# Patient Record
Sex: Female | Born: 1950 | Race: White | Hispanic: No | Marital: Single
Health system: Southern US, Community
[De-identification: ages and names within clinical notes are randomized; demographics above are authoritative.]

## PROBLEM LIST (undated history)

## (undated) DIAGNOSIS — J9621 Acute and chronic respiratory failure with hypoxia: Secondary | ICD-10-CM

## (undated) DIAGNOSIS — I469 Cardiac arrest, cause unspecified: Secondary | ICD-10-CM

## (undated) DIAGNOSIS — N184 Chronic kidney disease, stage 4 (severe): Secondary | ICD-10-CM

## (undated) DIAGNOSIS — J189 Pneumonia, unspecified organism: Secondary | ICD-10-CM

## (undated) DIAGNOSIS — U071 COVID-19: Secondary | ICD-10-CM

---

## 2020-11-19 ENCOUNTER — Other Ambulatory Visit (HOSPITAL_COMMUNITY): Payer: Medicare Other

## 2020-11-19 ENCOUNTER — Institutional Professional Consult (permissible substitution)
Admission: RE | Admit: 2020-11-19 | Discharge: 2020-12-09 | Disposition: A | Discharge status: Rehabilitation Facility | Payer: Medicare Other | Attending: Internal Medicine | Admitting: Internal Medicine

## 2020-11-19 DIAGNOSIS — J189 Pneumonia, unspecified organism: Secondary | ICD-10-CM | POA: Diagnosis present

## 2020-11-19 DIAGNOSIS — J9621 Acute and chronic respiratory failure with hypoxia: Secondary | ICD-10-CM | POA: Diagnosis present

## 2020-11-19 DIAGNOSIS — N184 Chronic kidney disease, stage 4 (severe): Secondary | ICD-10-CM | POA: Diagnosis present

## 2020-11-19 DIAGNOSIS — Z931 Gastrostomy status: Secondary | ICD-10-CM

## 2020-11-19 DIAGNOSIS — U071 COVID-19: Secondary | ICD-10-CM | POA: Diagnosis present

## 2020-11-19 DIAGNOSIS — I469 Cardiac arrest, cause unspecified: Secondary | ICD-10-CM | POA: Diagnosis present

## 2020-11-19 HISTORY — DX: COVID-19: U07.1

## 2020-11-19 HISTORY — DX: Chronic kidney disease, stage 4 (severe): N18.4

## 2020-11-19 HISTORY — DX: Cardiac arrest, cause unspecified: I46.9

## 2020-11-19 HISTORY — DX: Pneumonia, unspecified organism: J18.9

## 2020-11-19 HISTORY — DX: Acute and chronic respiratory failure with hypoxia: J96.21

## 2020-11-19 MED ORDER — IOHEXOL 180 MG/ML  SOLN
50.0000 mL | Freq: Once | INTRAMUSCULAR | Status: AC | PRN
  Administered 2020-11-19: 50 mL

## 2020-11-20 DIAGNOSIS — J9621 Acute and chronic respiratory failure with hypoxia: Secondary | ICD-10-CM | POA: Diagnosis not present

## 2020-11-20 DIAGNOSIS — J189 Pneumonia, unspecified organism: Secondary | ICD-10-CM

## 2020-11-20 DIAGNOSIS — I469 Cardiac arrest, cause unspecified: Secondary | ICD-10-CM | POA: Diagnosis not present

## 2020-11-20 DIAGNOSIS — N184 Chronic kidney disease, stage 4 (severe): Secondary | ICD-10-CM | POA: Diagnosis not present

## 2020-11-20 DIAGNOSIS — U071 COVID-19: Secondary | ICD-10-CM | POA: Diagnosis not present

## 2020-11-20 LAB — BASIC METABOLIC PANEL
Anion gap: 10 (ref 5–15)
BUN: 61 mg/dL — ABNORMAL HIGH (ref 8–23)
CO2: 27 mmol/L (ref 22–32)
Calcium: 8 mg/dL — ABNORMAL LOW (ref 8.9–10.3)
Chloride: 104 mmol/L (ref 98–111)
Creatinine, Ser: 2.32 mg/dL — ABNORMAL HIGH (ref 0.44–1.00)
GFR, Estimated: 22 mL/min — ABNORMAL LOW (ref >60)
Glucose, Bld: 138 mg/dL — ABNORMAL HIGH (ref 70–99)
Potassium: 3.8 mmol/L (ref 3.5–5.1)
Sodium: 141 mmol/L (ref 135–145)

## 2020-11-20 LAB — CBC
HCT: 24.1 % — ABNORMAL LOW (ref 36.0–46.0)
Hemoglobin: 7.4 g/dL — ABNORMAL LOW (ref 12.0–15.0)
MCH: 30 pg (ref 26.0–34.0)
MCHC: 30.7 g/dL (ref 30.0–36.0)
MCV: 97.6 fL (ref 80.0–100.0)
Platelets: 295 10*3/uL (ref 150–400)
RBC: 2.47 MIL/uL — ABNORMAL LOW (ref 3.87–5.11)
RDW: 16.3 % — ABNORMAL HIGH (ref 11.5–15.5)
WBC: 7 10*3/uL (ref 4.0–10.5)
nRBC: 0 % (ref 0.0–0.2)

## 2020-11-20 NOTE — Consult Note (Signed)
Pulmonary [place]  Date of Service: 11/20/2020  PULMONARY CRITICAL CARE CONSULT   DYANNA KAMBER  [id number]  DOB: 08/23/1951   DOA: 11/19/2020  Referring Physician: Merton Border, MD  HPI: Elizabeth Goodwin is a 70 y.o. female seen for follow up of Acute on Chronic Respiratory Failure.  Patient has multiple medical problems including chronic kidney disease hypertension hyperlipidemia coronary artery disease diabetes morbid obesity who came into the hospital because of having been found unresponsive.  The patient apparently was in cardiac arrest had recently tested positive for COVID-19 patient came into the emergency department on the ventilator and admitted to the ICU at the time of his initial presentation she was in pulseless electrical activity.  Hospital course was complicated had worsening of her renal function and eventually also was hypotensive.  Patient also was placed on temporary dialysis it appears.  Other complications included development of pneumonia treated for healthcare infection.  Because of failure to wean she was transferred to our facility after trach and PEG had been placed  Review of Systems:  ROS performed and is unremarkable other than noted above.   Past medical history: Chronic kidney disease stage IV Hypertension Hyperlipidemia Coronary artery disease Diabetes Morbid obesity  Past surgical history: Tracheostomy PEG tube  Social history: Unknown tobacco alcohol or drug abuse   Medications: Reviewed on Rounds  Physical Exam:  Vitals: Temperature is 97.6 pulse 85 respiratory 29 blood pressure is 118/82 saturations 96%  Ventilator Settings on T collar FiO2 is 35%  . General: Comfortable at this time . Eyes: Grossly normal lids, irises & conjunctiva . ENT: grossly tongue is normal . Neck: no obvious mass . Cardiovascular: S1-S2 normal no gallop or rub . Respiratory: No rhonchi very  coarse breath sound . Abdomen: Soft and nontender . Skin: no rash seen on limited exam . Musculoskeletal: not rigid . Psychiatric:unable to assess . Neurologic: no seizure no involuntary movements         Labs on Admission:  Basic Metabolic Panel: Recent Labs  Lab 11/20/20 0447  NA 141  K 3.8  CL 104  CO2 27  GLUCOSE 138*  BUN 61*  CREATININE 2.32*  CALCIUM 8.0*    No results for input(s): PHART, PCO2ART, PO2ART, HCO3, O2SAT in the last 168 hours.  Liver Function Tests: No results for input(s): AST, ALT, ALKPHOS, BILITOT, PROT, ALBUMIN in the last 168 hours. No results for input(s): LIPASE, AMYLASE in the last 168 hours. No results for input(s): AMMONIA in the last 168 hours.  CBC: Recent Labs  Lab 11/20/20 0447  WBC 7.0  HGB 7.4*  HCT 24.1*  MCV 97.6  PLT 295    Cardiac Enzymes: No results for input(s): CKTOTAL, CKMB, CKMBINDEX, TROPONINI in the last 168 hours.  BNP (last 3 results) No results for input(s): BNP in the last 8760 hours.  ProBNP (last 3 results) No results for input(s): PROBNP in the last 8760 hours.   Radiological Exams on Admission: DG ABDOMEN PEG TUBE LOCATION  Result Date: 11/19/2020 CLINICAL DATA:  Peg tube placement EXAM: ABDOMEN - 1 VIEW COMPARISON:  None. FINDINGS: Frontal view of the upper abdomen was obtained after the installation of oral contrast into an indwelling percutaneous gastrostomy tube by the clinician. Contrast is seen outlining the gastric rugal folds. No evidence of contrast extravasation. Bowel gas pattern is unremarkable. IMPRESSION: 1. Percutaneous gastrostomy tube within the gastric lumen. No evidence of contrast extravasation. Electronically Signed  By: Randa Ngo M.D.   On: 11/19/2020 22:45    Assessment/Plan Active Problems:   Acute on chronic respiratory failure with hypoxia (HCC)   Chronic kidney disease, stage IV (severe) ([place])   COVID-19 virus infection   Cardiac arrest ([place])   Healthcare-associated  pneumonia   1. Acute on chronic respiratory failure hypoxia patient is doing well so far with the weaning currently is on 35% FiO2.  We will try to advance the weaning as tolerated. 2. Chronic kidney disease stage IV we will continue to monitor urine output.  Consider nephrology follow-up 3. COVID-19 virus infection in recovery phase we will continue with supportive care. 4. Cardiac arrest right now rhythm is stable we will continue to monitor closely 5. Healthcare associated pneumonia treated  I have personally seen and evaluated the patient, evaluated laboratory and imaging results, formulated the assessment and plan and placed orders. The Patient requires high complexity decision making with multiple systems involvement.  Case was discussed on Rounds with the Respiratory Therapy Director and the Respiratory staff Time Spent 64mnutes  Woodley Petzold A Nneoma Harral, MD FCommunity Memorial HospitalPulmonary Critical Care Medicine Sleep Medicine

## 2020-11-21 DIAGNOSIS — I469 Cardiac arrest, cause unspecified: Secondary | ICD-10-CM | POA: Diagnosis not present

## 2020-11-21 DIAGNOSIS — U071 COVID-19: Secondary | ICD-10-CM | POA: Diagnosis not present

## 2020-11-21 DIAGNOSIS — J9621 Acute and chronic respiratory failure with hypoxia: Secondary | ICD-10-CM | POA: Diagnosis not present

## 2020-11-21 DIAGNOSIS — N184 Chronic kidney disease, stage 4 (severe): Secondary | ICD-10-CM | POA: Diagnosis not present

## 2020-11-21 NOTE — Progress Notes (Signed)
Pulmonary Critical Care Medicine [place]   PULMONARY CRITICAL CARE SERVICE  PROGRESS NOTE  Date of Service: 11/21/2020  Elizabeth Goodwin  [id number]  DOB: 29-Dec-1950   DOA: 11/19/2020  Referring Physician: Merton Border, MD  HPI: Elizabeth Goodwin is a 70 y.o. female seen for follow up of Acute on Chronic Respiratory Failure.  Patient is currently on T collar on 35% FiO2 ready for downsizing the trach  Medications: Reviewed on Rounds  Physical Exam:  Vitals: Temperature is 98.0 pulse 80 respiratory 19 blood pressure is 112/73 saturations 96  Ventilator Settings on T collar with an FiO2 of 35  . General: Comfortable at this time . Eyes: Grossly normal lids, irises & conjunctiva . ENT: grossly tongue is normal . Neck: no obvious mass . Cardiovascular: S1 S2 normal no gallop . Respiratory: Scattered rhonchi expansion is equal at this time . Abdomen: soft . Skin: no rash seen on limited exam . Musculoskeletal: not rigid . Psychiatric:unable to assess . Neurologic: no seizure no involuntary movements         Lab Data:   Basic Metabolic Panel: Recent Labs  Lab 11/20/20 0447  NA 141  K 3.8  CL 104  CO2 27  GLUCOSE 138*  BUN 61*  CREATININE 2.32*  CALCIUM 8.0*    ABG: No results for input(s): PHART, PCO2ART, PO2ART, HCO3, O2SAT in the last 168 hours.  Liver Function Tests: No results for input(s): AST, ALT, ALKPHOS, BILITOT, PROT, ALBUMIN in the last 168 hours. No results for input(s): LIPASE, AMYLASE in the last 168 hours. No results for input(s): AMMONIA in the last 168 hours.  CBC: Recent Labs  Lab 11/20/20 0447  WBC 7.0  HGB 7.4*  HCT 24.1*  MCV 97.6  PLT 295    Cardiac Enzymes: No results for input(s): CKTOTAL, CKMB, CKMBINDEX, TROPONINI in the last 168 hours.  BNP (last 3 results) No results for input(s): BNP in the last 8760 hours.  ProBNP (last 3 results) No results for input(s): PROBNP in the last 8760  hours.  Radiological Exams: DG ABDOMEN PEG TUBE LOCATION  Result Date: 11/19/2020 CLINICAL DATA:  Peg tube placement EXAM: ABDOMEN - 1 VIEW COMPARISON:  None. FINDINGS: Frontal view of the upper abdomen was obtained after the installation of oral contrast into an indwelling percutaneous gastrostomy tube by the clinician. Contrast is seen outlining the gastric rugal folds. No evidence of contrast extravasation. Bowel gas pattern is unremarkable. IMPRESSION: 1. Percutaneous gastrostomy tube within the gastric lumen. No evidence of contrast extravasation. Electronically Signed   By: Randa Ngo M.D.   On: 11/19/2020 22:45    Assessment/Plan Active Problems:   Acute on chronic respiratory failure with hypoxia (HCC)   Chronic kidney disease, stage IV (severe) ([place])   COVID-19 virus infection   Cardiac arrest ([place])   Healthcare-associated pneumonia   1. Acute on chronic respiratory failure hypoxia we will proceed to downsizing the trach to a #6 cuffless continue with T collar weaning as tolerated 2. Chronic kidney disease stage IV we will continue with supportive care. 3. COVID-19 virus infection recovery phase 4. Cardiac arrest rhythm stable 5. Healthcare associated pneumonia treated we will continue to follow   I have personally seen and evaluated the patient, evaluated laboratory and imaging results, formulated the assessment and plan and placed orders. The Patient requires high complexity decision making with multiple systems involvement.  Rounds were done with the Respiratory Therapy Director and Staff therapists and discussed with nursing staff  also.  Allyne Gee, MD [facility] Pulmonary Critical Care Medicine Sleep Medicine

## 2020-11-22 DIAGNOSIS — N184 Chronic kidney disease, stage 4 (severe): Secondary | ICD-10-CM | POA: Diagnosis not present

## 2020-11-22 DIAGNOSIS — J9621 Acute and chronic respiratory failure with hypoxia: Secondary | ICD-10-CM | POA: Diagnosis not present

## 2020-11-22 DIAGNOSIS — U071 COVID-19: Secondary | ICD-10-CM | POA: Diagnosis not present

## 2020-11-22 DIAGNOSIS — I469 Cardiac arrest, cause unspecified: Secondary | ICD-10-CM | POA: Diagnosis not present

## 2020-11-22 NOTE — Progress Notes (Signed)
Pulmonary Critical Care Medicine [place]   PULMONARY CRITICAL CARE SERVICE  PROGRESS NOTE  Date of Service: 11/22/2020  Elizabeth Goodwin  [id number]  DOB: 12/13/1950   DOA: 11/19/2020  Referring Physician: Merton Border, MD  HPI: Elizabeth Goodwin is a 70 y.o. female seen for follow up of Acute on Chronic Respiratory Failure.  On T collar right now on 35% FiO2 using PMV  Medications: Reviewed on Rounds  Physical Exam:  Vitals: Temperature is 96.5 pulse 81 respiratory 20 blood pressure is 155/79 saturations 96%  Ventilator Settings on T collar with an FiO2 of 35%  . General: Comfortable at this time . Eyes: Grossly normal lids, irises & conjunctiva . ENT: grossly tongue is normal . Neck: no obvious mass . Cardiovascular: S1 S2 normal no gallop . Respiratory: Scattered rhonchi expansion is equal . Abdomen: soft . Skin: no rash seen on limited exam . Musculoskeletal: not rigid . Psychiatric:unable to assess . Neurologic: no seizure no involuntary movements         Lab Data:   Basic Metabolic Panel: Recent Labs  Lab 11/20/20 0447  NA 141  K 3.8  CL 104  CO2 27  GLUCOSE 138*  BUN 61*  CREATININE 2.32*  CALCIUM 8.0*    ABG: No results for input(s): PHART, PCO2ART, PO2ART, HCO3, O2SAT in the last 168 hours.  Liver Function Tests: No results for input(s): AST, ALT, ALKPHOS, BILITOT, PROT, ALBUMIN in the last 168 hours. No results for input(s): LIPASE, AMYLASE in the last 168 hours. No results for input(s): AMMONIA in the last 168 hours.  CBC: Recent Labs  Lab 11/20/20 0447  WBC 7.0  HGB 7.4*  HCT 24.1*  MCV 97.6  PLT 295    Cardiac Enzymes: No results for input(s): CKTOTAL, CKMB, CKMBINDEX, TROPONINI in the last 168 hours.  BNP (last 3 results) No results for input(s): BNP in the last 8760 hours.  ProBNP (last 3 results) No results for input(s): PROBNP in the last 8760 hours.  Radiological Exams: No results  found.  Assessment/Plan Active Problems:   Acute on chronic respiratory failure with hypoxia (HCC)   Chronic kidney disease, stage IV (severe) ([place])   COVID-19 virus infection   Cardiac arrest ([place])   Healthcare-associated pneumonia   1. Acute on chronic respiratory failure with hypoxia we will continue with T collar trials titrate oxygen continue pulmonary toilet secretions are fairly moderate 2. Chronic kidney disease stage IV we will continue to follow 3. COVID-19 virus infection recovery phase 4. Cardiac arrest rhythm is stable 5. Healthcare associated pneumonia treated slowly improving   I have personally seen and evaluated the patient, evaluated laboratory and imaging results, formulated the assessment and plan and placed orders. The Patient requires high complexity decision making with multiple systems involvement.  Rounds were done with the Respiratory Therapy Director and Staff therapists and discussed with nursing staff also.  Allyne Gee, MD [facility] Pulmonary Critical Care Medicine Sleep Medicine

## 2020-11-23 ENCOUNTER — Encounter: Payer: Self-pay | Admitting: Internal Medicine

## 2020-11-23 DIAGNOSIS — U071 COVID-19: Secondary | ICD-10-CM | POA: Diagnosis not present

## 2020-11-23 DIAGNOSIS — N184 Chronic kidney disease, stage 4 (severe): Secondary | ICD-10-CM | POA: Diagnosis present

## 2020-11-23 DIAGNOSIS — I469 Cardiac arrest, cause unspecified: Secondary | ICD-10-CM | POA: Diagnosis present

## 2020-11-23 DIAGNOSIS — J9621 Acute and chronic respiratory failure with hypoxia: Secondary | ICD-10-CM | POA: Diagnosis present

## 2020-11-23 DIAGNOSIS — J189 Pneumonia, unspecified organism: Secondary | ICD-10-CM | POA: Diagnosis present

## 2020-11-23 LAB — CBC
HCT: 25 % — ABNORMAL LOW (ref 36.0–46.0)
Hemoglobin: 7.7 g/dL — ABNORMAL LOW (ref 12.0–15.0)
MCH: 30.1 pg (ref 26.0–34.0)
MCHC: 30.8 g/dL (ref 30.0–36.0)
MCV: 97.7 fL (ref 80.0–100.0)
Platelets: 330 10*3/uL (ref 150–400)
RBC: 2.56 MIL/uL — ABNORMAL LOW (ref 3.87–5.11)
RDW: 15.6 % — ABNORMAL HIGH (ref 11.5–15.5)
WBC: 7.6 10*3/uL (ref 4.0–10.5)
nRBC: 0 % (ref 0.0–0.2)

## 2020-11-23 LAB — BASIC METABOLIC PANEL
Anion gap: 8 (ref 5–15)
BUN: 79 mg/dL — ABNORMAL HIGH (ref 8–23)
CO2: 28 mmol/L (ref 22–32)
Calcium: 8.1 mg/dL — ABNORMAL LOW (ref 8.9–10.3)
Chloride: 101 mmol/L (ref 98–111)
Creatinine, Ser: 2.2 mg/dL — ABNORMAL HIGH (ref 0.44–1.00)
GFR, Estimated: 24 mL/min — ABNORMAL LOW (ref >60)
Glucose, Bld: 241 mg/dL — ABNORMAL HIGH (ref 70–99)
Potassium: 4.9 mmol/L (ref 3.5–5.1)
Sodium: 137 mmol/L (ref 135–145)

## 2020-11-23 NOTE — Progress Notes (Signed)
Pulmonary Critical Care Medicine [place]   PULMONARY CRITICAL CARE SERVICE  PROGRESS NOTE  Date of Service: 11/23/2020  LAKEILA GRUNDEN  [id number]  DOB: 18-Mar-1951   DOA: 11/19/2020  Referring Physician: Merton Border, MD  HPI: IANTHE FRADY is a 70 y.o. female seen for follow up of Acute on Chronic Respiratory Failure.  Patient at this time is on T collar on 30% FiO2 seems to be tolerating it well  Medications: Reviewed on Rounds  Physical Exam:  Vitals: Temperature is 96.7 pulse 78 respiratory 21 blood pressure is 130/74 saturations 99%  Ventilator Settings on T collar with an FiO2 of 30%  . General: Comfortable at this time . Eyes: Grossly normal lids, irises & conjunctiva . ENT: grossly tongue is normal . Neck: no obvious mass . Cardiovascular: S1 S2 normal no gallop . Respiratory: No rhonchi very coarse breath . Abdomen: soft . Skin: no rash seen on limited exam . Musculoskeletal: not rigid . Psychiatric:unable to assess . Neurologic: no seizure no involuntary movements         Lab Data:   Basic Metabolic Panel: Recent Labs  Lab 11/20/20 0447 11/23/20 0537  NA 141 137  K 3.8 4.9  CL 104 101  CO2 27 28  GLUCOSE 138* 241*  BUN 61* 79*  CREATININE 2.32* 2.20*  CALCIUM 8.0* 8.1*    ABG: No results for input(s): PHART, PCO2ART, PO2ART, HCO3, O2SAT in the last 168 hours.  Liver Function Tests: No results for input(s): AST, ALT, ALKPHOS, BILITOT, PROT, ALBUMIN in the last 168 hours. No results for input(s): LIPASE, AMYLASE in the last 168 hours. No results for input(s): AMMONIA in the last 168 hours.  CBC: Recent Labs  Lab 11/20/20 0447 11/23/20 0537  WBC 7.0 7.6  HGB 7.4* 7.7*  HCT 24.1* 25.0*  MCV 97.6 97.7  PLT 295 330    Cardiac Enzymes: No results for input(s): CKTOTAL, CKMB, CKMBINDEX, TROPONINI in the last 168 hours.  BNP (last 3 results) No results for input(s): BNP in the last 8760 hours.  ProBNP (last 3  results) No results for input(s): PROBNP in the last 8760 hours.  Radiological Exams: No results found.  Assessment/Plan Active Problems:   Acute on chronic respiratory failure with hypoxia (HCC)   Chronic kidney disease, stage IV (severe) ([place])   COVID-19 virus infection   Cardiac arrest ([place])   Healthcare-associated pneumonia   1. Acute on chronic respiratory failure hypoxia we will continue with T collar trials on 30% FiO2 looks good. 2. Chronic kidney disease stage IV baseline 3. COVID-19 virus infection recovery 4. Cardiac arrest rhythm stable we will continue to monitor. 5. Healthcare associated pneumonia treated   I have personally seen and evaluated the patient, evaluated laboratory and imaging results, formulated the assessment and plan and placed orders. The Patient requires high complexity decision making with multiple systems involvement.  Rounds were done with the Respiratory Therapy Director and Staff therapists and discussed with nursing staff also.  Allyne Gee, MD [facility] Pulmonary Critical Care Medicine Sleep Medicine

## 2020-11-24 DIAGNOSIS — I469 Cardiac arrest, cause unspecified: Secondary | ICD-10-CM | POA: Diagnosis not present

## 2020-11-24 DIAGNOSIS — N184 Chronic kidney disease, stage 4 (severe): Secondary | ICD-10-CM | POA: Diagnosis not present

## 2020-11-24 DIAGNOSIS — J9621 Acute and chronic respiratory failure with hypoxia: Secondary | ICD-10-CM | POA: Diagnosis not present

## 2020-11-24 DIAGNOSIS — U071 COVID-19: Secondary | ICD-10-CM | POA: Diagnosis not present

## 2020-11-24 NOTE — Progress Notes (Signed)
Pulmonary Critical Care Medicine [place]   PULMONARY CRITICAL CARE SERVICE  PROGRESS NOTE  Date of Service: 11/24/2020  TIANNA BASTIANELLI  [id number]  DOB: [birth year]   DOA: 11/19/2020  Referring Physician: Merton Border, MD  HPI: Elizabeth Goodwin is a 70 y.o. female seen for follow up of Acute on Chronic Respiratory Failure.  Patient currently is on T collar on 28% FiO2 with good saturations.  Medications: Reviewed on Rounds  Physical Exam:  Vitals: Temperature is 97.6 pulse 76 respiratory rate is 19 blood pressure is 153/82 saturations 98%  Ventilator Settings patient is on T collar with an FiO2 28%  . General: Comfortable at this time . Eyes: Grossly normal lids, irises & conjunctiva . ENT: grossly tongue is normal . Neck: no obvious mass . Cardiovascular: S1 S2 normal no gallop . Respiratory: Scattered rhonchi expansion is equal . Abdomen: soft . Skin: no rash seen on limited exam . Musculoskeletal: not rigid . Psychiatric:unable to assess . Neurologic: no seizure no involuntary movements         Lab Data:   Basic Metabolic Panel: Recent Labs  Lab 11/20/20 0447 11/23/20 0537  NA 141 137  K 3.8 4.9  CL 104 101  CO2 27 28  GLUCOSE 138* 241*  BUN 61* 79*  CREATININE 2.32* 2.20*  CALCIUM 8.0* 8.1*    ABG: No results for input(s): PHART, PCO2ART, PO2ART, HCO3, O2SAT in the last 168 hours.  Liver Function Tests: No results for input(s): AST, ALT, ALKPHOS, BILITOT, PROT, ALBUMIN in the last 168 hours. No results for input(s): LIPASE, AMYLASE in the last 168 hours. No results for input(s): AMMONIA in the last 168 hours.  CBC: Recent Labs  Lab 11/20/20 0447 11/23/20 0537  WBC 7.0 7.6  HGB 7.4* 7.7*  HCT 24.1* 25.0*  MCV 97.6 97.7  PLT 295 330    Cardiac Enzymes: No results for input(s): CKTOTAL, CKMB, CKMBINDEX, TROPONINI in the last 168 hours.  BNP (last 3 results) No results for input(s): BNP in the last 8760  hours.  ProBNP (last 3 results) No results for input(s): PROBNP in the last 8760 hours.  Radiological Exams: No results found.  Assessment/Plan Active Problems:   Acute on chronic respiratory failure with hypoxia (HCC)   Chronic kidney disease, stage IV (severe) ([place])   COVID-19 virus infection   Cardiac arrest ([place])   Healthcare-associated pneumonia   1. Acute on chronic respiratory failure hypoxia continue with T collar trials patient is on 20% FiO2. 2. Chronic kidney disease stage IV we will continue to follow 3. COVID-19 virus infection recovery 4. Cardiac arrest rhythm stable 5. Healthcare associated pneumonia treated slowly improved   I have personally seen and evaluated the patient, evaluated laboratory and imaging results, formulated the assessment and plan and placed orders. The Patient requires high complexity decision making with multiple systems involvement.  Rounds were done with the Respiratory Therapy Director and Staff therapists and discussed with nursing staff also.  Allyne Gee, MD [facility] Pulmonary Critical Care Medicine Sleep Medicine

## 2020-11-25 DIAGNOSIS — N184 Chronic kidney disease, stage 4 (severe): Secondary | ICD-10-CM | POA: Diagnosis not present

## 2020-11-25 DIAGNOSIS — I469 Cardiac arrest, cause unspecified: Secondary | ICD-10-CM | POA: Diagnosis not present

## 2020-11-25 DIAGNOSIS — U071 COVID-19: Secondary | ICD-10-CM | POA: Diagnosis not present

## 2020-11-25 DIAGNOSIS — J9621 Acute and chronic respiratory failure with hypoxia: Secondary | ICD-10-CM | POA: Diagnosis not present

## 2020-11-25 NOTE — Progress Notes (Signed)
Pulmonary Critical Care Medicine [place]   PULMONARY CRITICAL CARE SERVICE  PROGRESS NOTE  Date of Service: 11/25/2020  Elizabeth Goodwin  [id number]  DOB: [birth year]   DOA: 11/19/2020  Referring Physician: Merton Border, MD  HPI: Elizabeth Goodwin is a 70 y.o. female seen for follow up of Acute on Chronic Respiratory Failure.  Patient currently is on T collar has been on 28% FiO2 with good saturations noted.  Medications: Reviewed on Rounds  Physical Exam:  Vitals: Temperature 97.1 pulse 83 respiratory rate 16 blood pressure is 117/55 saturations 94%  Ventilator Settings on T collar with an FiO2 of 28%  . General: Comfortable at this time . Eyes: Grossly normal lids, irises & conjunctiva . ENT: grossly tongue is normal . Neck: no obvious mass . Cardiovascular: S1 S2 normal no gallop . Respiratory: Coarse breath sounds with a few scattered rhonchi . Abdomen: soft . Skin: no rash seen on limited exam . Musculoskeletal: not rigid . Psychiatric:unable to assess . Neurologic: no seizure no involuntary movements         Lab Data:   Basic Metabolic Panel: Recent Labs  Lab 11/20/20 0447 11/23/20 0537  NA 141 137  K 3.8 4.9  CL 104 101  CO2 27 28  GLUCOSE 138* 241*  BUN 61* 79*  CREATININE 2.32* 2.20*  CALCIUM 8.0* 8.1*    ABG: No results for input(s): PHART, PCO2ART, PO2ART, HCO3, O2SAT in the last 168 hours.  Liver Function Tests: No results for input(s): AST, ALT, ALKPHOS, BILITOT, PROT, ALBUMIN in the last 168 hours. No results for input(s): LIPASE, AMYLASE in the last 168 hours. No results for input(s): AMMONIA in the last 168 hours.  CBC: Recent Labs  Lab 11/20/20 0447 11/23/20 0537  WBC 7.0 7.6  HGB 7.4* 7.7*  HCT 24.1* 25.0*  MCV 97.6 97.7  PLT 295 330    Cardiac Enzymes: No results for input(s): CKTOTAL, CKMB, CKMBINDEX, TROPONINI in the last 168 hours.  BNP (last 3 results) No results for input(s): BNP in the last 8760  hours.  ProBNP (last 3 results) No results for input(s): PROBNP in the last 8760 hours.  Radiological Exams: No results found.  Assessment/Plan Active Problems:   Acute on chronic respiratory failure with hypoxia (HCC)   Chronic kidney disease, stage IV (severe) ([place])   COVID-19 virus infection   Cardiac arrest ([place])   Healthcare-associated pneumonia   1. Acute on chronic respiratory failure with hypoxia currently on T collar trials patient is on 28% FiO2 2. Chronic kidney disease stage IV we will continue with supportive care 3. COVID-19 virus infection recovery 4. Cardiac arrest rhythm stable 5. Healthcare associated pneumonia treated we will continue to follow along.   I have personally seen and evaluated the patient, evaluated laboratory and imaging results, formulated the assessment and plan and placed orders. The Patient requires high complexity decision making with multiple systems involvement.  Rounds were done with the Respiratory Therapy Director and Staff therapists and discussed with nursing staff also.  Allyne Gee, MD [facility] Pulmonary Critical Care Medicine Sleep Medicine

## 2020-11-26 DIAGNOSIS — J9621 Acute and chronic respiratory failure with hypoxia: Secondary | ICD-10-CM | POA: Diagnosis not present

## 2020-11-26 DIAGNOSIS — I469 Cardiac arrest, cause unspecified: Secondary | ICD-10-CM | POA: Diagnosis not present

## 2020-11-26 DIAGNOSIS — N184 Chronic kidney disease, stage 4 (severe): Secondary | ICD-10-CM | POA: Diagnosis not present

## 2020-11-26 DIAGNOSIS — U071 COVID-19: Secondary | ICD-10-CM | POA: Diagnosis not present

## 2020-11-26 NOTE — Progress Notes (Signed)
Pulmonary Critical Care Medicine [place]   PULMONARY CRITICAL CARE SERVICE  PROGRESS NOTE  Date of Service: 11/26/2020  Elizabeth Goodwin  [id number]  DOB: 1951-01-04   DOA: 11/19/2020  Referring Physician: Merton Border, MD  HPI: Elizabeth Goodwin is a 70 y.o. female seen for follow up of Acute on Chronic Respiratory Failure.  Patient is off the ventilator on T collar has been doing well with 28% FiO2 should be able to do the PMV  Medications: Reviewed on Rounds  Physical Exam:  Vitals: Temperature is 97.8 pulse 82 respiratory rate 16 blood pressure is 161/72 saturations 96%  Ventilator Settings on T collar with an FiO2 of 28%  . General: Comfortable at this time . Eyes: Grossly normal lids, irises & conjunctiva . ENT: grossly tongue is normal . Neck: no obvious mass . Cardiovascular: S1 S2 normal no gallop . Respiratory: No rhonchi no rales noted . Abdomen: soft . Skin: no rash seen on limited exam . Musculoskeletal: not rigid . Psychiatric:unable to assess . Neurologic: no seizure no involuntary movements         Lab Data:   Basic Metabolic Panel: Recent Labs  Lab 11/20/20 0447 11/23/20 0537  NA 141 137  K 3.8 4.9  CL 104 101  CO2 27 28  GLUCOSE 138* 241*  BUN 61* 79*  CREATININE 2.32* 2.20*  CALCIUM 8.0* 8.1*    ABG: No results for input(s): PHART, PCO2ART, PO2ART, HCO3, O2SAT in the last 168 hours.  Liver Function Tests: No results for input(s): AST, ALT, ALKPHOS, BILITOT, PROT, ALBUMIN in the last 168 hours. No results for input(s): LIPASE, AMYLASE in the last 168 hours. No results for input(s): AMMONIA in the last 168 hours.  CBC: Recent Labs  Lab 11/20/20 0447 11/23/20 0537  WBC 7.0 7.6  HGB 7.4* 7.7*  HCT 24.1* 25.0*  MCV 97.6 97.7  PLT 295 330    Cardiac Enzymes: No results for input(s): CKTOTAL, CKMB, CKMBINDEX, TROPONINI in the last 168 hours.  BNP (last 3 results) No results for input(s): BNP in the last 8760  hours.  ProBNP (last 3 results) No results for input(s): PROBNP in the last 8760 hours.  Radiological Exams: No results found.  Assessment/Plan Active Problems:   Acute on chronic respiratory failure with hypoxia (HCC)   Chronic kidney disease, stage IV (severe) ([place])   COVID-19 virus infection   Cardiac arrest ([place])   Healthcare-associated pneumonia   1. Acute on chronic respiratory failure hypoxia we will continue with T collar at [place] 2. Clinic kidney disease stage IV supportive care 3. COVID-19 virus infection in recovery 4. Healthcare associated pneumonia treated slow improvement 5. Cardiac arrest rhythm has been stable   I have personally seen and evaluated the patient, evaluated laboratory and imaging results, formulated the assessment and plan and placed orders. The Patient requires high complexity decision making with multiple systems involvement.  Rounds were done with the Respiratory Therapy Director and Staff therapists and discussed with nursing staff also.  Allyne Gee, MD [facility] Pulmonary Critical Care Medicine Sleep Medicine

## 2020-11-27 DIAGNOSIS — J9621 Acute and chronic respiratory failure with hypoxia: Secondary | ICD-10-CM | POA: Diagnosis not present

## 2020-11-27 DIAGNOSIS — I469 Cardiac arrest, cause unspecified: Secondary | ICD-10-CM | POA: Diagnosis not present

## 2020-11-27 DIAGNOSIS — U071 COVID-19: Secondary | ICD-10-CM | POA: Diagnosis not present

## 2020-11-27 DIAGNOSIS — N184 Chronic kidney disease, stage 4 (severe): Secondary | ICD-10-CM | POA: Diagnosis not present

## 2020-11-27 NOTE — Progress Notes (Signed)
Pulmonary Critical Care Medicine [place]   PULMONARY CRITICAL CARE SERVICE  PROGRESS NOTE  Date of Service: 11/27/2020  Elizabeth Goodwin  [id number]  DOB: 19-Jan-1951   DOA: 11/19/2020  Referring Physician: Merton Border, MD  HPI: Elizabeth Goodwin is a 70 y.o. female seen for follow up of Acute on Chronic Respiratory Failure.  Patient is on T collar currently on 28% FiO2 using PMV as tolerated  Medications: Reviewed on Rounds  Physical Exam:  Vitals: Temperature is 98.1 pulse 79 respiratory rate is 15 blood pressure 125/64 saturations 94%  Ventilator Settings on T collar with an FiO2 of 28%  . General: Comfortable at this time . Eyes: Grossly normal lids, irises & conjunctiva . ENT: grossly tongue is normal . Neck: no obvious mass . Cardiovascular: S1 S2 normal no gallop . Respiratory: Scattered rhonchi expansion is equal . Abdomen: soft . Skin: no rash seen on limited exam . Musculoskeletal: not rigid . Psychiatric:unable to assess . Neurologic: no seizure no involuntary movements         Lab Data:   Basic Metabolic Panel: Recent Labs  Lab 11/23/20 0537  NA 137  K 4.9  CL 101  CO2 28  GLUCOSE 241*  BUN 79*  CREATININE 2.20*  CALCIUM 8.1*    ABG: No results for input(s): PHART, PCO2ART, PO2ART, HCO3, O2SAT in the last 168 hours.  Liver Function Tests: No results for input(s): AST, ALT, ALKPHOS, BILITOT, PROT, ALBUMIN in the last 168 hours. No results for input(s): LIPASE, AMYLASE in the last 168 hours. No results for input(s): AMMONIA in the last 168 hours.  CBC: Recent Labs  Lab 11/23/20 0537  WBC 7.6  HGB 7.7*  HCT 25.0*  MCV 97.7  PLT 330    Cardiac Enzymes: No results for input(s): CKTOTAL, CKMB, CKMBINDEX, TROPONINI in the last 168 hours.  BNP (last 3 results) No results for input(s): BNP in the last 8760 hours.  ProBNP (last 3 results) No results for input(s): PROBNP in the last 8760 hours.  Radiological  Exams: No results found.  Assessment/Plan Active Problems:   Acute on chronic respiratory failure with hypoxia (HCC)   Chronic kidney disease, stage IV (severe) ([place])   COVID-19 virus infection   Cardiac arrest ([place])   Healthcare-associated pneumonia   1. Acute on chronic respiratory failure hypoxia we will continue with T-piece patient has been doing PMV trials as tolerated so far looks okay 2. Chronic kidney disease stage IV we will continue with supportive care 3. COVID-19 virus infection recovery 4. Cardiac arrest rhythm stable 5. Healthcare associated pneumonia treatment continue to monitor   I have personally seen and evaluated the patient, evaluated laboratory and imaging results, formulated the assessment and plan and placed orders. The Patient requires high complexity decision making with multiple systems involvement.  Rounds were done with the Respiratory Therapy Director and Staff therapists and discussed with nursing staff also.  Allyne Gee, MD [facility] Pulmonary Critical Care Medicine Sleep Medicine

## 2020-11-28 DIAGNOSIS — U071 COVID-19: Secondary | ICD-10-CM | POA: Diagnosis not present

## 2020-11-28 DIAGNOSIS — N184 Chronic kidney disease, stage 4 (severe): Secondary | ICD-10-CM | POA: Diagnosis not present

## 2020-11-28 DIAGNOSIS — J9621 Acute and chronic respiratory failure with hypoxia: Secondary | ICD-10-CM | POA: Diagnosis not present

## 2020-11-28 DIAGNOSIS — I469 Cardiac arrest, cause unspecified: Secondary | ICD-10-CM | POA: Diagnosis not present

## 2020-11-28 NOTE — Progress Notes (Signed)
Pulmonary Critical Care Medicine [place]   PULMONARY CRITICAL CARE SERVICE  PROGRESS NOTE  Date of Service: 11/28/2020  Elizabeth Goodwin  [id number]  DOB: [birth year]   DOA: 11/19/2020  Referring Physician: Merton Border, MD  HPI: Elizabeth Goodwin is a 70 y.o. female seen for follow up of Acute on Chronic Respiratory Failure.  Doing well weaning ready for capping  Medications: Reviewed on Rounds  Physical Exam:  Vitals: Temperature is 98.2 pulse 81 respiratory rate is 15 blood pressure is 150/70 saturations 97%  Ventilator Settings off the ventilator on T collar FiO2 28%  . General: Comfortable at this time . Eyes: Grossly normal lids, irises & conjunctiva . ENT: grossly tongue is normal . Neck: no obvious mass . Cardiovascular: S1 S2 normal no gallop . Respiratory: Scattered rhonchi very coarse percent . Abdomen: soft . Skin: no rash seen on limited exam . Musculoskeletal: not rigid . Psychiatric:unable to assess . Neurologic: no seizure no involuntary movements         Lab Data:   Basic Metabolic Panel: Recent Labs  Lab 11/23/20 0537  NA 137  K 4.9  CL 101  CO2 28  GLUCOSE 241*  BUN 79*  CREATININE 2.20*  CALCIUM 8.1*    ABG: No results for input(s): PHART, PCO2ART, PO2ART, HCO3, O2SAT in the last 168 hours.  Liver Function Tests: No results for input(s): AST, ALT, ALKPHOS, BILITOT, PROT, ALBUMIN in the last 168 hours. No results for input(s): LIPASE, AMYLASE in the last 168 hours. No results for input(s): AMMONIA in the last 168 hours.  CBC: Recent Labs  Lab 11/23/20 0537  WBC 7.6  HGB 7.7*  HCT 25.0*  MCV 97.7  PLT 330    Cardiac Enzymes: No results for input(s): CKTOTAL, CKMB, CKMBINDEX, TROPONINI in the last 168 hours.  BNP (last 3 results) No results for input(s): BNP in the last 8760 hours.  ProBNP (last 3 results) No results for input(s): PROBNP in the last 8760 hours.  Radiological Exams: No results  found.  Assessment/Plan Active Problems:   Acute on chronic respiratory failure with hypoxia (HCC)   Chronic kidney disease, stage IV (severe) ([place])   COVID-19 virus infection   Cardiac arrest ([place])   Healthcare-associated pneumonia   1. Acute on chronic respiratory failure hypoxia plan is going to be continue with T collar trials patient is on 20% FiO2 has been tolerating the PMV in addition to that we will attempt capping 2. Chronic kidney disease stage IV continue with supportive care 3. COVID-19 virus infection recovery 4. Cardiac arrest rhythm stable 5. Healthcare associated pneumonia treated slowly improved   I have personally seen and evaluated the patient, evaluated laboratory and imaging results, formulated the assessment and plan and placed orders. The Patient requires high complexity decision making with multiple systems involvement.  Rounds were done with the Respiratory Therapy Director and Staff therapists and discussed with nursing staff also.  Allyne Gee, MD [facility] Pulmonary Critical Care Medicine Sleep Medicine

## 2020-11-29 DIAGNOSIS — N184 Chronic kidney disease, stage 4 (severe): Secondary | ICD-10-CM | POA: Diagnosis not present

## 2020-11-29 DIAGNOSIS — J9621 Acute and chronic respiratory failure with hypoxia: Secondary | ICD-10-CM | POA: Diagnosis not present

## 2020-11-29 DIAGNOSIS — U071 COVID-19: Secondary | ICD-10-CM | POA: Diagnosis not present

## 2020-11-29 DIAGNOSIS — I469 Cardiac arrest, cause unspecified: Secondary | ICD-10-CM | POA: Diagnosis not present

## 2020-11-29 LAB — BASIC METABOLIC PANEL
Anion gap: 10 (ref 5–15)
BUN: 92 mg/dL — ABNORMAL HIGH (ref 8–23)
CO2: 27 mmol/L (ref 22–32)
Calcium: 8.7 mg/dL — ABNORMAL LOW (ref 8.9–10.3)
Chloride: 104 mmol/L (ref 98–111)
Creatinine, Ser: 1.94 mg/dL — ABNORMAL HIGH (ref 0.44–1.00)
GFR, Estimated: 28 mL/min — ABNORMAL LOW (ref >60)
Glucose, Bld: 249 mg/dL — ABNORMAL HIGH (ref 70–99)
Potassium: 4.5 mmol/L (ref 3.5–5.1)
Sodium: 141 mmol/L (ref 135–145)

## 2020-11-29 LAB — CBC
HCT: 25 % — ABNORMAL LOW (ref 36.0–46.0)
Hemoglobin: 7.3 g/dL — ABNORMAL LOW (ref 12.0–15.0)
MCH: 28.9 pg (ref 26.0–34.0)
MCHC: 29.2 g/dL — ABNORMAL LOW (ref 30.0–36.0)
MCV: 98.8 fL (ref 80.0–100.0)
Platelets: 269 10*3/uL (ref 150–400)
RBC: 2.53 MIL/uL — ABNORMAL LOW (ref 3.87–5.11)
RDW: 16.4 % — ABNORMAL HIGH (ref 11.5–15.5)
WBC: 9.6 10*3/uL (ref 4.0–10.5)
nRBC: 0 % (ref 0.0–0.2)

## 2020-11-29 NOTE — Progress Notes (Signed)
Pulmonary Critical Care Medicine [place]   PULMONARY CRITICAL CARE SERVICE  PROGRESS NOTE  Date of Service: 11/29/2020  Elizabeth Goodwin  [id number]  DOB: 06-24-1951   DOA: 11/19/2020  Referring Physician: Merton Border, MD  HPI: Elizabeth Goodwin is a 70 y.o. female seen for follow up of Acute on Chronic Respiratory Failure.  Patient is capping has been on 1 L FiO2 good saturations are noted  Medications: Reviewed on Rounds  Physical Exam:  Vitals: Temperature is 97.9 pulse 82 respiratory is 15 blood pressure is 145/81 saturations 97%  Ventilator Settings capping on 1 L O2  . General: Comfortable at this time . Eyes: Grossly normal lids, irises & conjunctiva . ENT: grossly tongue is normal . Neck: no obvious mass . Cardiovascular: S1 S2 normal no gallop . Respiratory: Scattered rhonchi coarse breath sound . Abdomen: soft . Skin: no rash seen on limited exam . Musculoskeletal: not rigid . Psychiatric:unable to assess . Neurologic: no seizure no involuntary movements         Lab Data:   Basic Metabolic Panel: Recent Labs  Lab 11/23/20 0537 11/29/20 0414  NA 137 141  K 4.9 4.5  CL 101 104  CO2 28 27  GLUCOSE 241* 249*  BUN 79* 92*  CREATININE 2.20* 1.94*  CALCIUM 8.1* 8.7*    ABG: No results for input(s): PHART, PCO2ART, PO2ART, HCO3, O2SAT in the last 168 hours.  Liver Function Tests: No results for input(s): AST, ALT, ALKPHOS, BILITOT, PROT, ALBUMIN in the last 168 hours. No results for input(s): LIPASE, AMYLASE in the last 168 hours. No results for input(s): AMMONIA in the last 168 hours.  CBC: Recent Labs  Lab 11/23/20 0537 11/29/20 0414  WBC 7.6 9.6  HGB 7.7* 7.3*  HCT 25.0* 25.0*  MCV 97.7 98.8  PLT 330 269    Cardiac Enzymes: No results for input(s): CKTOTAL, CKMB, CKMBINDEX, TROPONINI in the last 168 hours.  BNP (last 3 results) No results for input(s): BNP in the last 8760 hours.  ProBNP (last 3 results) No  results for input(s): PROBNP in the last 8760 hours.  Radiological Exams: No results found.  Assessment/Plan Active Problems:   Acute on chronic respiratory failure with hypoxia (HCC)   Chronic kidney disease, stage IV (severe) ([place])   COVID-19 virus infection   Cardiac arrest ([place])   Healthcare-associated pneumonia   1. Acute on chronic respiratory failure with hypoxia we will proceed with capping will be completing 24 hours today 2. Chronic kidney disease stage IV supportive care monitor labs 3. COVID-19 virus infection recovery 4. Cardiac arrest rhythm stable 5. Healthcare associated pneumonia treated   I have personally seen and evaluated the patient, evaluated laboratory and imaging results, formulated the assessment and plan and placed orders. The Patient requires high complexity decision making with multiple systems involvement.  Rounds were done with the Respiratory Therapy Director and Staff therapists and discussed with nursing staff also.  Allyne Gee, MD [facility] Pulmonary Critical Care Medicine Sleep Medicine

## 2020-11-30 DIAGNOSIS — N184 Chronic kidney disease, stage 4 (severe): Secondary | ICD-10-CM | POA: Diagnosis not present

## 2020-11-30 DIAGNOSIS — U071 COVID-19: Secondary | ICD-10-CM | POA: Diagnosis not present

## 2020-11-30 DIAGNOSIS — I469 Cardiac arrest, cause unspecified: Secondary | ICD-10-CM | POA: Diagnosis not present

## 2020-11-30 DIAGNOSIS — J9621 Acute and chronic respiratory failure with hypoxia: Secondary | ICD-10-CM | POA: Diagnosis not present

## 2020-11-30 NOTE — Progress Notes (Signed)
Pulmonary Critical Care Medicine [place]   PULMONARY CRITICAL CARE SERVICE  PROGRESS NOTE  Date of Service: 11/30/2020  Elizabeth Goodwin  [id number]  DOB: [birth year]   DOA: 11/19/2020  Referring Physician: Merton Border, MD  HPI: Elizabeth Goodwin is a 70 y.o. female seen for follow up of Acute on Chronic Respiratory Failure.  Patient is capping currently on nasal cannula 1 L doing very well ready for decannulation  Medications: Reviewed on Rounds  Physical Exam:  Vitals: Temperature 97.0 pulse 83 respiratory 22 blood pressure is 148/81 saturations 94%  Ventilator Settings capping of the ventilator  . General: Comfortable at this time . Eyes: Grossly normal lids, irises & conjunctiva . ENT: grossly tongue is normal . Neck: no obvious mass . Cardiovascular: S1 S2 normal no gallop . Respiratory: No rhonchi no rales are noted at this time . Abdomen: soft . Skin: no rash seen on limited exam . Musculoskeletal: not rigid . Psychiatric:unable to assess . Neurologic: no seizure no involuntary movements         Lab Data:   Basic Metabolic Panel: Recent Labs  Lab 11/29/20 0414  NA 141  K 4.5  CL 104  CO2 27  GLUCOSE 249*  BUN 92*  CREATININE 1.94*  CALCIUM 8.7*    ABG: No results for input(s): PHART, PCO2ART, PO2ART, HCO3, O2SAT in the last 168 hours.  Liver Function Tests: No results for input(s): AST, ALT, ALKPHOS, BILITOT, PROT, ALBUMIN in the last 168 hours. No results for input(s): LIPASE, AMYLASE in the last 168 hours. No results for input(s): AMMONIA in the last 168 hours.  CBC: Recent Labs  Lab 11/29/20 0414  WBC 9.6  HGB 7.3*  HCT 25.0*  MCV 98.8  PLT 269    Cardiac Enzymes: No results for input(s): CKTOTAL, CKMB, CKMBINDEX, TROPONINI in the last 168 hours.  BNP (last 3 results) No results for input(s): BNP in the last 8760 hours.  ProBNP (last 3 results) No results for input(s): PROBNP in the last 8760  hours.  Radiological Exams: No results found.  Assessment/Plan Active Problems:   Acute on chronic respiratory failure with hypoxia (HCC)   Chronic kidney disease, stage IV (severe) ([place])   COVID-19 virus infection   Cardiac arrest ([place])   Healthcare-associated pneumonia   1. Acute on chronic respiratory failure with hypoxia we will proceed to decannulate 2. Chronic kidney disease stage IV we will continue with supportive care 3. COVID-19 virus infection recovery 4. Cardiac arrest rhythm is stable 5. Healthcare associated pneumonia treated continue to follow along   I have personally seen and evaluated the patient, evaluated laboratory and imaging results, formulated the assessment and plan and placed orders. The Patient requires high complexity decision making with multiple systems involvement.  Rounds were done with the Respiratory Therapy Director and Staff therapists and discussed with nursing staff also.  Allyne Gee, MD [facility] Pulmonary Critical Care Medicine Sleep Medicine

## 2020-12-01 DIAGNOSIS — U071 COVID-19: Secondary | ICD-10-CM | POA: Diagnosis not present

## 2020-12-01 DIAGNOSIS — N184 Chronic kidney disease, stage 4 (severe): Secondary | ICD-10-CM | POA: Diagnosis not present

## 2020-12-01 DIAGNOSIS — J9621 Acute and chronic respiratory failure with hypoxia: Secondary | ICD-10-CM | POA: Diagnosis not present

## 2020-12-01 DIAGNOSIS — I469 Cardiac arrest, cause unspecified: Secondary | ICD-10-CM | POA: Diagnosis not present

## 2020-12-01 NOTE — Progress Notes (Signed)
Pulmonary Critical Care Medicine [place]   PULMONARY CRITICAL CARE SERVICE  PROGRESS NOTE  Date of Service: 12/01/2020  Elizabeth Goodwin  [id number]  DOB: Nov 14, 1950   DOA: 11/19/2020  Referring Physician: Merton Border, MD  HPI: Elizabeth Goodwin is a 70 y.o. female seen for follow up of Acute on Chronic Respiratory Failure. Patient currently is doing well has been decannulated yesterday no issues today  Medications: Reviewed on Rounds  Physical Exam:  Vitals: Temperature 97.0 pulse 80 respiratory 29 blood pressure is 173/97 saturations 99%  Ventilator Settings off the ventilator decannulated successfully  . General: Comfortable at this time . Eyes: Grossly normal lids, irises & conjunctiva . ENT: grossly tongue is normal . Neck: no obvious mass . Cardiovascular: S1 S2 normal no gallop . Respiratory: Scattered rhonchi very coarse breath sound . Abdomen: soft . Skin: no rash seen on limited exam . Musculoskeletal: not rigid . Psychiatric:unable to assess . Neurologic: no seizure no involuntary movements         Lab Data:   Basic Metabolic Panel: Recent Labs  Lab 11/29/20 0414  NA 141  K 4.5  CL 104  CO2 27  GLUCOSE 249*  BUN 92*  CREATININE 1.94*  CALCIUM 8.7*    ABG: No results for input(s): PHART, PCO2ART, PO2ART, HCO3, O2SAT in the last 168 hours.  Liver Function Tests: No results for input(s): AST, ALT, ALKPHOS, BILITOT, PROT, ALBUMIN in the last 168 hours. No results for input(s): LIPASE, AMYLASE in the last 168 hours. No results for input(s): AMMONIA in the last 168 hours.  CBC: Recent Labs  Lab 11/29/20 0414  WBC 9.6  HGB 7.3*  HCT 25.0*  MCV 98.8  PLT 269    Cardiac Enzymes: No results for input(s): CKTOTAL, CKMB, CKMBINDEX, TROPONINI in the last 168 hours.  BNP (last 3 results) No results for input(s): BNP in the last 8760 hours.  ProBNP (last 3 results) No results for input(s): PROBNP in the last 8760  hours.  Radiological Exams: No results found.  Assessment/Plan Active Problems:   Acute on chronic respiratory failure with hypoxia (HCC)   Chronic kidney disease, stage IV (severe) ([place])   COVID-19 virus infection   Cardiac arrest ([place])   Healthcare-associated pneumonia   1. Acute on chronic respiratory failure hypoxia patient successfully decannulated 2. Chronic kidney disease stage IV supportive care 3. COVID-19 infection we will continue to monitor 4. Cardiac arrest rhythm is stable 5. Healthcare associated pneumonia treated improved   I have personally seen and evaluated the patient, evaluated laboratory and imaging results, formulated the assessment and plan and placed orders. The Patient requires high complexity decision making with multiple systems involvement.  Rounds were done with the Respiratory Therapy Director and Staff therapists and discussed with nursing staff also.  Allyne Gee, MD [facility] Pulmonary Critical Care Medicine Sleep Medicine

## 2020-12-03 ENCOUNTER — Other Ambulatory Visit (HOSPITAL_COMMUNITY): Payer: Medicare Other

## 2020-12-04 LAB — GLUCOSE, RANDOM: Glucose, Bld: 366 mg/dL — ABNORMAL HIGH (ref 70–99)

## 2020-12-05 LAB — CBC
HCT: 21.4 % — ABNORMAL LOW (ref 36.0–46.0)
HCT: 23.6 % — ABNORMAL LOW (ref 36.0–46.0)
Hemoglobin: 6.6 g/dL — CL (ref 12.0–15.0)
Hemoglobin: 7.8 g/dL — ABNORMAL LOW (ref 12.0–15.0)
MCH: 30.1 pg (ref 26.0–34.0)
MCH: 30.4 pg (ref 26.0–34.0)
MCHC: 30.8 g/dL (ref 30.0–36.0)
MCHC: 33.1 g/dL (ref 30.0–36.0)
MCV: 97.7 fL (ref 80.0–100.0)
MCV: 91.8 fL (ref 80.0–100.0)
Platelets: 176 10*3/uL (ref 150–400)
Platelets: 166 10*3/uL (ref 150–400)
RBC: 2.19 MIL/uL — ABNORMAL LOW (ref 3.87–5.11)
RBC: 2.57 MIL/uL — ABNORMAL LOW (ref 3.87–5.11)
RDW: 17.3 % — ABNORMAL HIGH (ref 11.5–15.5)
RDW: 18.3 % — ABNORMAL HIGH (ref 11.5–15.5)
WBC: 10.5 10*3/uL (ref 4.0–10.5)
WBC: 7.9 10*3/uL (ref 4.0–10.5)
nRBC: 0 % (ref 0.0–0.2)
nRBC: 0 % (ref 0.0–0.2)

## 2020-12-05 LAB — BASIC METABOLIC PANEL
Anion gap: 10 (ref 5–15)
BUN: 146 mg/dL — ABNORMAL HIGH (ref 8–23)
CO2: 22 mmol/L (ref 22–32)
Calcium: 8.1 mg/dL — ABNORMAL LOW (ref 8.9–10.3)
Chloride: 101 mmol/L (ref 98–111)
Creatinine, Ser: 2.21 mg/dL — ABNORMAL HIGH (ref 0.44–1.00)
GFR, Estimated: 24 mL/min — ABNORMAL LOW (ref >60)
Glucose, Bld: 60 mg/dL — ABNORMAL LOW (ref 70–99)
Potassium: 4.7 mmol/L (ref 3.5–5.1)
Sodium: 133 mmol/L — ABNORMAL LOW (ref 135–145)

## 2020-12-05 LAB — PREPARE RBC (CROSSMATCH)

## 2020-12-05 LAB — ABO/RH: ABO/RH(D): A NEG

## 2020-12-05 LAB — NOVEL CORONAVIRUS, NAA (HOSP ORDER, SEND-OUT TO REF LAB; TAT 18-24 HRS): SARS-CoV-2, NAA: NOT DETECTED

## 2020-12-06 LAB — CBC
HCT: 22 % — ABNORMAL LOW (ref 36.0–46.0)
Hemoglobin: 7.3 g/dL — ABNORMAL LOW (ref 12.0–15.0)
MCH: 30.3 pg (ref 26.0–34.0)
MCHC: 33.2 g/dL (ref 30.0–36.0)
MCV: 91.3 fL (ref 80.0–100.0)
Platelets: 171 10*3/uL (ref 150–400)
RBC: 2.41 MIL/uL — ABNORMAL LOW (ref 3.87–5.11)
RDW: 18.2 % — ABNORMAL HIGH (ref 11.5–15.5)
WBC: 9 10*3/uL (ref 4.0–10.5)
nRBC: 0 % (ref 0.0–0.2)

## 2020-12-06 LAB — BASIC METABOLIC PANEL
Anion gap: 9 (ref 5–15)
BUN: 155 mg/dL — ABNORMAL HIGH (ref 8–23)
CO2: 21 mmol/L — ABNORMAL LOW (ref 22–32)
Calcium: 8.2 mg/dL — ABNORMAL LOW (ref 8.9–10.3)
Chloride: 104 mmol/L (ref 98–111)
Creatinine, Ser: 2.28 mg/dL — ABNORMAL HIGH (ref 0.44–1.00)
GFR, Estimated: 23 mL/min — ABNORMAL LOW (ref >60)
Glucose, Bld: 63 mg/dL — ABNORMAL LOW (ref 70–99)
Potassium: 4.9 mmol/L (ref 3.5–5.1)
Sodium: 134 mmol/L — ABNORMAL LOW (ref 135–145)

## 2020-12-07 LAB — BASIC METABOLIC PANEL
Anion gap: 9 (ref 5–15)
BUN: 155 mg/dL — ABNORMAL HIGH (ref 8–23)
CO2: 20 mmol/L — ABNORMAL LOW (ref 22–32)
Calcium: 8.1 mg/dL — ABNORMAL LOW (ref 8.9–10.3)
Chloride: 104 mmol/L (ref 98–111)
Creatinine, Ser: 2.21 mg/dL — ABNORMAL HIGH (ref 0.44–1.00)
GFR, Estimated: 24 mL/min — ABNORMAL LOW (ref >60)
Glucose, Bld: 71 mg/dL (ref 70–99)
Potassium: 4.7 mmol/L (ref 3.5–5.1)
Sodium: 133 mmol/L — ABNORMAL LOW (ref 135–145)

## 2020-12-07 LAB — CBC
HCT: 22.4 % — ABNORMAL LOW (ref 36.0–46.0)
Hemoglobin: 7.1 g/dL — ABNORMAL LOW (ref 12.0–15.0)
MCH: 29.8 pg (ref 26.0–34.0)
MCHC: 31.7 g/dL (ref 30.0–36.0)
MCV: 94.1 fL (ref 80.0–100.0)
Platelets: 175 10*3/uL (ref 150–400)
RBC: 2.38 MIL/uL — ABNORMAL LOW (ref 3.87–5.11)
RDW: 18.1 % — ABNORMAL HIGH (ref 11.5–15.5)
WBC: 9 10*3/uL (ref 4.0–10.5)
nRBC: 0 % (ref 0.0–0.2)

## 2020-12-07 LAB — TSH: TSH: 19.573 u[IU]/mL — ABNORMAL HIGH (ref 0.350–4.500)

## 2020-12-08 LAB — CBC
HCT: 21.3 % — ABNORMAL LOW (ref 36.0–46.0)
Hemoglobin: 7 g/dL — ABNORMAL LOW (ref 12.0–15.0)
MCH: 31 pg (ref 26.0–34.0)
MCHC: 32.9 g/dL (ref 30.0–36.0)
MCV: 94.2 fL (ref 80.0–100.0)
Platelets: 168 10*3/uL (ref 150–400)
RBC: 2.26 MIL/uL — ABNORMAL LOW (ref 3.87–5.11)
RDW: 18.3 % — ABNORMAL HIGH (ref 11.5–15.5)
WBC: 8.5 10*3/uL (ref 4.0–10.5)
nRBC: 0 % (ref 0.0–0.2)

## 2020-12-08 LAB — BASIC METABOLIC PANEL
Anion gap: 8 (ref 5–15)
BUN: 156 mg/dL — ABNORMAL HIGH (ref 8–23)
CO2: 19 mmol/L — ABNORMAL LOW (ref 22–32)
Calcium: 8.2 mg/dL — ABNORMAL LOW (ref 8.9–10.3)
Chloride: 108 mmol/L (ref 98–111)
Creatinine, Ser: 2.18 mg/dL — ABNORMAL HIGH (ref 0.44–1.00)
GFR, Estimated: 24 mL/min — ABNORMAL LOW (ref >60)
Glucose, Bld: 69 mg/dL — ABNORMAL LOW (ref 70–99)
Potassium: 4.9 mmol/L (ref 3.5–5.1)
Sodium: 135 mmol/L (ref 135–145)

## 2020-12-08 LAB — PREPARE RBC (CROSSMATCH)

## 2020-12-08 LAB — HEMOGLOBIN AND HEMATOCRIT, BLOOD
HCT: 26.9 % — ABNORMAL LOW (ref 36.0–46.0)
Hemoglobin: 8.5 g/dL — ABNORMAL LOW (ref 12.0–15.0)

## 2020-12-09 LAB — BPAM RBC
Blood Product Expiration Date: 202203312359
Blood Product Expiration Date: 202203312359
ISSUE DATE / TIME: 202203031515
ISSUE DATE / TIME: 202203060953
Unit Type and Rh: 600
Unit Type and Rh: 600

## 2020-12-09 LAB — TYPE AND SCREEN
ABO/RH(D): A NEG
Antibody Screen: POSITIVE
DAT, IgG: POSITIVE
Donor AG Type: NEGATIVE
Donor AG Type: NEGATIVE
Unit division: 0
Unit division: 0

## 2020-12-10 LAB — NOVEL CORONAVIRUS, NAA (HOSP ORDER, SEND-OUT TO REF LAB; TAT 18-24 HRS): SARS-CoV-2, NAA: DETECTED — AB

## 2020-12-11 ENCOUNTER — Telehealth: Payer: Self-pay

## 2020-12-11 NOTE — Telephone Encounter (Signed)
Called to discuss with patient about COVID-19 symptoms and the use of one of the available treatments for those with mild to moderate Covid symptoms and at a high risk of hospitalization.  Pt appears to qualify for outpatient treatment due to co-morbid conditions and/or a member of an at-risk group in accordance with the FDA Emergency Use Authorization.    Symptom onset: Unknown Vaccinated: Unknown Booster? Unknown Immunocompromised? No Qualifiers: Respiratory failure Unclear if pt. Is in Rehab facility. Unable to reach pt - Left message and call back number [place]

## 2021-10-05 DEATH — deceased

## 2022-01-26 IMAGING — DX DG ABDOMEN 1V
1 series · 1 of 1 positions shown · non-contrast
Comparison: None.

CLINICAL DATA: Peg tube placement

EXAM:
ABDOMEN - 1 VIEW

[abdomen kub]
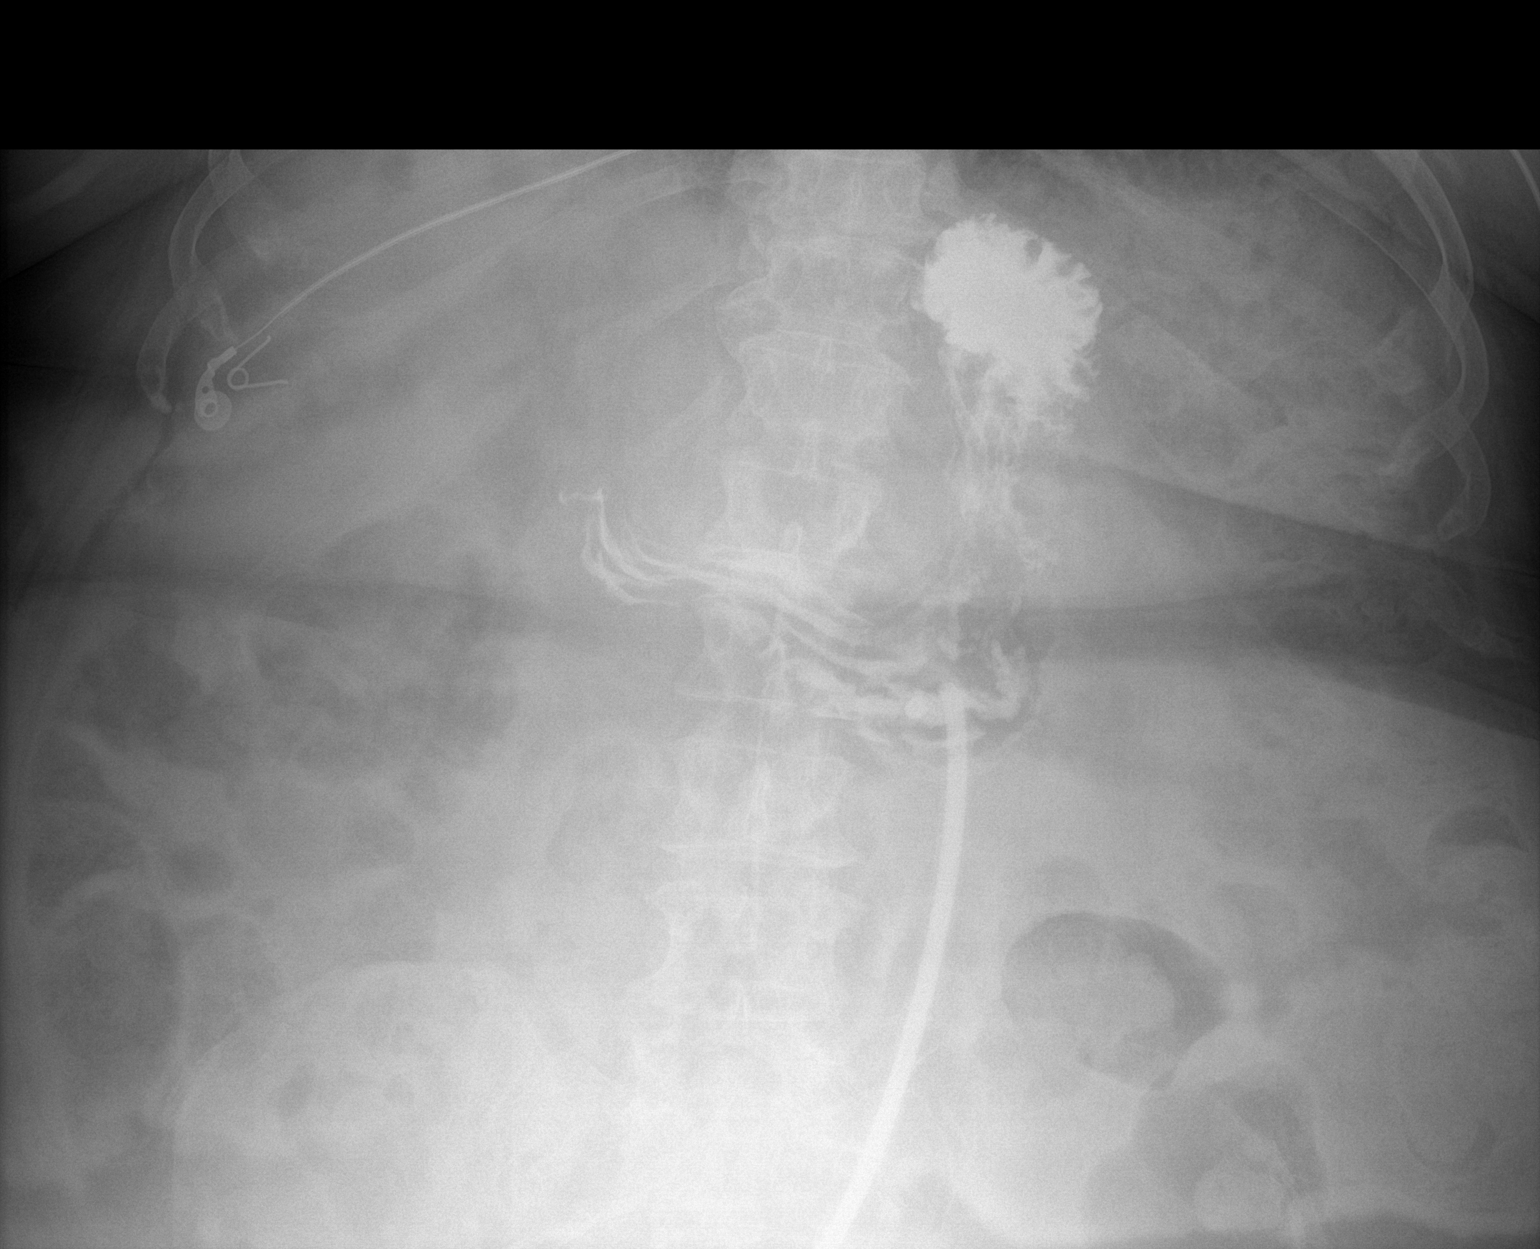

[1 of 1 positions shown; findings below may reference images not displayed]

FINDINGS: Frontal view of the upper abdomen was obtained after the
installation of oral contrast into an indwelling percutaneous
gastrostomy tube by the clinician. Contrast is seen outlining the
gastric rugal folds. No evidence of contrast extravasation. Bowel
gas pattern is unremarkable.
IMPRESSION: 1. Percutaneous gastrostomy tube within the gastric lumen. No
evidence of contrast extravasation.
